# Patient Record
Sex: Male | Born: 1995 | Race: Black or African American | Hispanic: No | Marital: Single | Smoking: Never smoker
Health system: Southern US, Community
[De-identification: ages and names within clinical notes are randomized; demographics above are authoritative.]

## PROBLEM LIST (undated history)

## (undated) DIAGNOSIS — E669 Obesity, unspecified: Secondary | ICD-10-CM

## (undated) HISTORY — PX: APPENDECTOMY: SHX54

---

## 2015-10-03 ENCOUNTER — Encounter (HOSPITAL_BASED_OUTPATIENT_CLINIC_OR_DEPARTMENT_OTHER): Payer: Self-pay | Admitting: *Deleted

## 2015-10-03 ENCOUNTER — Emergency Department (HOSPITAL_BASED_OUTPATIENT_CLINIC_OR_DEPARTMENT_OTHER)
Admission: EM | Admit: 2015-10-03 | Discharge: 2015-10-03 | Disposition: A | Discharge status: Home / Self Care | Payer: Self-pay | Attending: Emergency Medicine | Admitting: Emergency Medicine

## 2015-10-03 DIAGNOSIS — J029 Acute pharyngitis, unspecified: Secondary | ICD-10-CM | POA: Insufficient documentation

## 2015-10-03 LAB — MONONUCLEOSIS SCREEN: Mono Screen: NEGATIVE

## 2015-10-03 LAB — RAPID STREP SCREEN (MED CTR MEBANE ONLY): Streptococcus, Group A Screen (Direct): NEGATIVE

## 2015-10-03 MED ORDER — PREDNISONE 50 MG PO TABS: ORAL_TABLET | ORAL | 0 refills | Status: DC

## 2015-10-03 MED ORDER — PREDNISONE 50 MG PO TABS
60.0000 mg | ORAL_TABLET | Freq: Once | ORAL | Status: AC
  Administered 2015-10-03: 60 mg via ORAL
  Filled 2015-10-03: qty 1

## 2015-10-03 MED ORDER — IBUPROFEN 400 MG PO TABS
400.0000 mg | ORAL_TABLET | Freq: Once | ORAL | Status: AC
  Administered 2015-10-03: 400 mg via ORAL
  Filled 2015-10-03: qty 1

## 2015-10-03 MED ORDER — LIDOCAINE VISCOUS 2 % MT SOLN: 15.0000 mL | Freq: Four times a day (QID) | OROMUCOSAL | 0 refills | Status: DC | PRN

## 2015-10-03 MED ORDER — LIDOCAINE VISCOUS 2 % MT SOLN
20.0000 mL | Freq: Once | OROMUCOSAL | Status: AC
  Administered 2015-10-03: 15 mL via OROMUCOSAL
  Filled 2015-10-03: qty 30

## 2015-10-03 MED FILL — LIDOCAINE 2% VISCOUS SOLN: 2 | 2 days supply | Qty: 100 | Fill #0

## 2015-10-03 NOTE — ED Provider Notes (Signed)
[facility] Provider Note   CSN: [id number] Arrival date & time: 10/03/15  1143  First Provider Contact:  None       History   Chief Complaint Chief Complaint  Patient presents with  . Sore Throat    HPI  Blood pressure 124/80, pulse 87, temperature 98.9 F (37.2 C), temperature source Oral, resp. rate 20, height  (1.905 m), weight (!) 163.3 kg, SpO2 97 %.  Steve Tate is a 20 y.o. male complaining of sore throat with pain when swallowing, occipital headache and neck pain onset 1 week ago. Patient has been taking acetaminophen at home with little relief. He denies fever, chills, sick contacts, pain with neck movement, otalgia, difficulty swallowing his secretions, change in voice.  HPI  History reviewed. No pertinent past medical history.  There are no active problems to display for this patient.   Past Surgical History:  Procedure Laterality Date  . APPENDECTOMY         Home Medications    Prior to Admission medications   Not on File    Family History No family history on file.  Social History Social History  Substance Use Topics  . Smoking status: Never Smoker  . Smokeless tobacco: Never Used  . Alcohol use No     Allergies   Review of patient's allergies indicates no known allergies.   Review of Systems Review of Systems  10 systems reviewed and found to be negative, except as noted in the HPI.   Physical Exam Updated Vital Signs BP 124/80   Pulse 87   Temp 98.9 F (37.2 C) (Oral)   Resp 20   Ht  (1.905 m)   Wt (!) 163.3 kg   SpO2 97%   BMI 45.00 kg/m   Physical Exam  Constitutional: He is oriented to person, place, and time. He appears well-developed and well-nourished. No distress.  HENT:  Head: Normocephalic and atraumatic.  Mouth/Throat: Oropharyngeal exudate present.  Difficult to visualize posterior pharynx, bilateral tonsillar hypertrophy with what appears to be exudate. Patient has no  difficulty swallowing secretions, no tenderness to palpation under tongue, no tonge elevation.  Eyes: Conjunctivae and EOM are normal. Pupils are equal, round, and reactive to light.  Neck: Normal range of motion.  FROM to C-spine. Pt can touch chin to chest without discomfort. No TTP of midline cervical spine.   Cardiovascular: Normal rate, regular rhythm and intact distal pulses.   Pulmonary/Chest: Effort normal and breath sounds normal.  Abdominal: Soft. There is no tenderness.  Musculoskeletal: Normal range of motion.  Neurological: He is alert and oriented to person, place, and time.  Follows commands, Clear, goal oriented speech, Strength is 5 out of 5x4 extremities, patient ambulates with a coordinated in nonantalgic gait. Sensation is grossly intact.   Skin: He is not diaphoretic.  Psychiatric: He has a normal mood and affect.  Nursing note and vitals reviewed.    ED Treatments / Results  Labs (all labs ordered are listed, but only abnormal results are displayed) Labs Reviewed  RAPID STREP SCREEN (NOT AT [facility])  CULTURE, GROUP A STREP [facility])  MONONUCLEOSIS SCREEN    EKG  EKG Interpretation None       Radiology No results found.  Procedures Procedures (including critical care time)  Medications Ordered in ED Medications  lidocaine (XYLOCAINE) 2 % viscous mouth solution 20 mL (not administered)  predniSONE (DELTASONE) tablet 60 mg (not administered)  ibuprofen (ADVIL,MOTRIN) tablet 400 mg (not administered)  Initial Impression / Assessment and Plan / ED Course  I have reviewed the triage vital signs and the nursing notes.  Pertinent labs & imaging results that were available during my care of the patient were reviewed by me and considered in my medical decision making (see chart for details).  Clinical Course    Vitals:   10/03/15 1157 10/03/15 1158  BP:  124/80  Pulse:  87  Resp:  20  Temp:  98.9 F (37.2 C)  TempSrc:  Oral  SpO2:  97%    Weight: (!) 163.3 kg   Height: 6\' 3"  (1.905 m)     Medications  lidocaine (XYLOCAINE) 2 % viscous mouth solution 20 mL (not administered)  predniSONE (DELTASONE) tablet 60 mg (not administered)  ibuprofen (ADVIL,MOTRIN) tablet 400 mg (not administered)    Steve Tate is 20 y.o. male presenting with Sore throat and headache onset 1 week ago, neuro exam is nonfocal, no meningeal signs, patient is nontoxic appearing. He does have exudate. Rapid strep is negative, will check mono and strep culture pending. Patient given viscous lidocaine, prednisone and Advil for comfort.  No signs of deep tissue infection, patient feels improved, rapid strep and mono are negative, patient given work note and return precautions, resource guide given  Evaluation does not show pathology that would require ongoing emergent intervention or inpatient treatment. Pt is hemodynamically stable and mentating appropriately. Discussed findings and plan with patient/guardian, who agrees with care plan. All questions answered. Return precautions discussed and outpatient follow up given.      Final Clinical Impressions(s) / ED Diagnoses   Final diagnoses:  Viral pharyngitis    New Prescriptions New Prescriptions   LIDOCAINE (XYLOCAINE) 2 % SOLUTION    Use as directed 15 mLs in the mouth or throat every 6 (six) hours as needed.   PREDNISONE (DELTASONE) 50 MG TABLET    Take 1 tablet daily with breakfast     Wynetta Emeryicole Kieth Hartis, PA-C 10/03/15 1407    Maia PlanJoshua G Long, MD 10/03/15 1929

## 2015-10-03 NOTE — ED Triage Notes (Signed)
Sore throat, neck and head pain for a week. He has been taking OTC cold medications with not much relief.

## 2015-10-03 NOTE — Discharge Instructions (Signed)
Your strep and mono tests are negative, if the strep culture comes back positive we will call you. Rest, wash your hands frequently.  Do not hesitate to return to the emergency room for any new, worsening or concerning symptoms.  For pain control please take Ibuprofen (also known as Motrin or Advil)  (this is normally 2 over the counter pills) every 6 hours. Take with food to minimize stomach irritation.

## 2015-10-05 LAB — CULTURE, GROUP A STREP (THRC)

## 2017-06-13 ENCOUNTER — Other Ambulatory Visit: Payer: Self-pay

## 2017-06-13 ENCOUNTER — Emergency Department (HOSPITAL_BASED_OUTPATIENT_CLINIC_OR_DEPARTMENT_OTHER)
Admission: EM | Admit: 2017-06-13 | Discharge: 2017-06-13 | Disposition: A | Discharge status: Home / Self Care | Payer: Self-pay | Attending: Emergency Medicine | Admitting: Emergency Medicine

## 2017-06-13 ENCOUNTER — Encounter (HOSPITAL_BASED_OUTPATIENT_CLINIC_OR_DEPARTMENT_OTHER): Payer: Self-pay | Admitting: Emergency Medicine

## 2017-06-13 DIAGNOSIS — R202 Paresthesia of skin: Secondary | ICD-10-CM | POA: Insufficient documentation

## 2017-06-13 DIAGNOSIS — Z79899 Other long term (current) drug therapy: Secondary | ICD-10-CM | POA: Insufficient documentation

## 2017-06-13 LAB — URINALYSIS, ROUTINE W REFLEX MICROSCOPIC
Bilirubin Urine: NEGATIVE
Glucose, UA: NEGATIVE mg/dL
Hgb urine dipstick: NEGATIVE
Ketones, ur: NEGATIVE mg/dL
Leukocytes, UA: NEGATIVE
Nitrite: NEGATIVE
Protein, ur: NEGATIVE mg/dL
Specific Gravity, Urine: 1.025 (ref 1.005–1.030)
pH: 6.5 (ref 5.0–8.0)

## 2017-06-13 LAB — BASIC METABOLIC PANEL
Anion gap: 7 (ref 5–15)
BUN: 11 mg/dL (ref 6–20)
CO2: 24 mmol/L (ref 22–32)
Calcium: 9 mg/dL (ref 8.9–10.3)
Chloride: 104 mmol/L (ref 101–111)
Creatinine, Ser: 1.03 mg/dL (ref 0.61–1.24)
GFR calc Af Amer: >60 mL/min (ref >60)
GFR calc non Af Amer: >60 mL/min (ref >60)
Glucose, Bld: 79 mg/dL (ref 65–99)
Potassium: 3.7 mmol/L (ref 3.5–5.1)
Sodium: 135 mmol/L (ref 135–145)

## 2017-06-13 LAB — CBC WITH DIFFERENTIAL/PLATELET
Basophils Absolute: 0 10*3/uL (ref 0.0–0.1)
Basophils Relative: 0 %
Eosinophils Absolute: 0.2 10*3/uL (ref 0.0–0.7)
Eosinophils Relative: 3 %
HCT: 38.3 % — ABNORMAL LOW (ref 39.0–52.0)
Hemoglobin: 12.7 g/dL — ABNORMAL LOW (ref 13.0–17.0)
Lymphocytes Relative: 37 %
Lymphs Abs: 3.2 10*3/uL (ref 0.7–4.0)
MCH: 27.9 pg (ref 26.0–34.0)
MCHC: 33.2 g/dL (ref 30.0–36.0)
MCV: 84.2 fL (ref 78.0–100.0)
Monocytes Absolute: 1 10*3/uL (ref 0.1–1.0)
Monocytes Relative: 11 %
Neutro Abs: 4.3 10*3/uL (ref 1.7–7.7)
Neutrophils Relative %: 49 %
Platelets: 348 10*3/uL (ref 150–400)
RBC: 4.55 MIL/uL (ref 4.22–5.81)
RDW: 12.1 % (ref 11.5–15.5)
WBC: 8.7 10*3/uL (ref 4.0–10.5)

## 2017-06-13 LAB — CBG MONITORING, ED: Glucose-Capillary: 70 mg/dL (ref 65–99)

## 2017-06-13 NOTE — ED Provider Notes (Signed)
[facility] EMERGENCY DEPARTMENT Provider Note   CSN: [id number] Arrival date & time: 06/13/17  1507     History   Chief Complaint Chief Complaint  Patient presents with  . Tingling    HPI Steve Tate is a 22 y.o. male.  HPI   Steve Tate is a 22 y.o. male, patient with no known medical problems, presenting to the ED with intermittent tingling in hands for the past several months. Sometimes bilateral and sometimes individually. Lasts for a few minutes at a time.  Also endorses fatigue and frequent urination. States, "One of my family members was just diagnosed with diabetes and I wanted to get checked too."  Denies N/V/D, chest pain, abdominal pain, weakness, numbness, fever/chills, dysuria, hematuria, or any other complaints.     History reviewed. No pertinent past medical history.  There are no active problems to display for this patient.   Past Surgical History:  Procedure Laterality Date  . APPENDECTOMY          Home Medications    Prior to Admission medications   Medication Sig Start Date End Date Taking? Authorizing Provider  lidocaine (XYLOCAINE) 2 % solution Use as directed 15 mLs in the mouth or throat every 6 (six) hours as needed. 10/03/15   Pisciotta, Joni Reining, PA-C  predniSONE (DELTASONE) 50 MG tablet Take 1 tablet daily with breakfast 10/03/15   Pisciotta, Joni Reining, PA-C    Family History No family history on file.  Social History Social History   Tobacco Use  . Smoking status: Never Smoker  . Smokeless tobacco: Never Used  Substance Use Topics  . Alcohol use: No  . Drug use: No     Allergies   Patient has no known allergies.   Review of Systems Review of Systems  Constitutional: Negative for chills, diaphoresis and fever.  Eyes: Negative for visual disturbance.  Respiratory: Negative for shortness of breath.   Cardiovascular: Negative for chest pain.  Gastrointestinal: Negative for abdominal pain, blood in  stool, diarrhea, nausea and vomiting.  Neurological: Negative for dizziness, weakness, numbness and headaches.       Hand tingling  All other systems reviewed and are negative.    Physical Exam Updated Vital Signs BP 135/76   Pulse 74   Temp 98.3 F (36.8 C) (Oral)   Resp 20   Ht 6\' 2"  (1.88 m)   Wt (!) 184.4 kg (406 lb 8 oz)   SpO2 99%   BMI 52.19 kg/m   Physical Exam  Constitutional: He appears well-developed and well-nourished. No distress.  Patient playing on his phone throughout interview.  HENT:  Head: Normocephalic and atraumatic.  Mouth/Throat: Oropharynx is clear and moist.  Eyes: Pupils are equal, round, and reactive to light. Conjunctivae and EOM are normal.  Neck: Neck supple.  Cardiovascular: Normal rate, regular rhythm, normal heart sounds and intact distal pulses.  Pulses:      Radial pulses are 2+ on the right side, and 2+ on the left side.  Pulmonary/Chest: Effort normal and breath sounds normal. No respiratory distress.  Abdominal: Soft. There is no tenderness. There is no guarding.  Musculoskeletal: He exhibits no edema.  Lymphadenopathy:    He has no cervical adenopathy.  Neurological: He is alert.  No sensory deficits.  No noted speech deficits. No aphasia. Patient handles oral secretions without difficulty. No noted swallowing defects.  Equal grip strength bilaterally. Strength 5/5 in the upper extremities. Strength 5/5 with flexion and extension of the hips, knees, and ankles  bilaterally.  Negative Romberg. No gait disturbance.  Coordination intact including heel to shin and finger to nose.  Cranial nerves III-XII grossly intact.  No facial droop.   Skin: Skin is warm and dry. Capillary refill takes less than 2 seconds. He is not diaphoretic.  Hands and arms appropriately warm to the touch.  Psychiatric: He has a normal mood and affect. His behavior is normal.  Nursing note and vitals reviewed.    ED Treatments / Results  Labs (all labs  ordered are listed, but only abnormal results are displayed) Labs Reviewed  CBC WITH DIFFERENTIAL/PLATELET - Abnormal; Notable for the following components:      Result Value   Hemoglobin 12.7 (*)    HCT 38.3 (*)    All other components within normal limits  URINALYSIS, ROUTINE W REFLEX MICROSCOPIC  BASIC METABOLIC PANEL  CBG MONITORING, ED    EKG None  Radiology No results found.  Procedures Procedures (including critical care time)  Medications Ordered in ED Medications - No data to display   Initial Impression / Assessment and Plan / ED Course  I have reviewed the triage vital signs and the nursing notes.  Pertinent labs & imaging results that were available during my care of the patient were reviewed by me and considered in my medical decision making (see chart for details).     Patient presents with tingling in the hands as well as fatigue.  Minor anemia noted on CBC.  Patient lab work otherwise without abnormality and overall reassuring.  Patient will need to follow-up with a PCP on this matter.  Resources given.  Return precautions discussed.  Final Clinical Impressions(s) / ED Diagnoses   Final diagnoses:  Paresthesia of both hands    ED Discharge Orders    None       Concepcion LivingJoy, Shawn C, PA-C 06/13/17 Mitzi [id number]    Goldston, Scott, MD 06/14/17 1007

## 2017-06-13 NOTE — ED Triage Notes (Signed)
Pt presents with concerns of diabetes. He reports that he has tingling in both hands, fatigue and frequent urination. Family hx of diabetes.

## 2017-06-13 NOTE — Discharge Instructions (Addendum)
Your lab results showed no abnormalities that would be consistent with diabetes.   There is evidence of minor anemia.  This could be a cause for fatigue and hand tingling.  This should be further evaluated by a primary care provider.

## 2017-06-17 ENCOUNTER — Encounter (HOSPITAL_BASED_OUTPATIENT_CLINIC_OR_DEPARTMENT_OTHER): Payer: Self-pay | Admitting: *Deleted

## 2017-06-17 ENCOUNTER — Emergency Department (HOSPITAL_BASED_OUTPATIENT_CLINIC_OR_DEPARTMENT_OTHER): Payer: No Typology Code available for payment source

## 2017-06-17 ENCOUNTER — Emergency Department (HOSPITAL_BASED_OUTPATIENT_CLINIC_OR_DEPARTMENT_OTHER)
Admission: EM | Admit: 2017-06-17 | Discharge: 2017-06-17 | Disposition: A | Discharge status: Home / Self Care | Payer: No Typology Code available for payment source | Attending: Emergency Medicine | Admitting: Emergency Medicine

## 2017-06-17 ENCOUNTER — Other Ambulatory Visit: Payer: Self-pay

## 2017-06-17 DIAGNOSIS — M545 Low back pain: Secondary | ICD-10-CM | POA: Insufficient documentation

## 2017-06-17 DIAGNOSIS — R52 Pain, unspecified: Secondary | ICD-10-CM

## 2017-06-17 LAB — URINALYSIS, ROUTINE W REFLEX MICROSCOPIC
Bilirubin Urine: NEGATIVE
Glucose, UA: NEGATIVE mg/dL
Hgb urine dipstick: NEGATIVE
Ketones, ur: NEGATIVE mg/dL
Leukocytes, UA: NEGATIVE
Nitrite: NEGATIVE
Protein, ur: NEGATIVE mg/dL
Specific Gravity, Urine: 1.02 (ref 1.005–1.030)
pH: 6 (ref 5.0–8.0)

## 2017-06-17 MED ORDER — ACETAMINOPHEN 500 MG PO TABS
1000.0000 mg | ORAL_TABLET | Freq: Once | ORAL | Status: AC
  Administered 2017-06-17: 1000 mg via ORAL
  Filled 2017-06-17: qty 2

## 2017-06-17 MED ORDER — IBUPROFEN 400 MG PO TABS: 400.0000 mg | ORAL_TABLET | Freq: Four times a day (QID) | ORAL | 0 refills | Status: DC | PRN

## 2017-06-17 MED ORDER — ACETAMINOPHEN 325 MG PO TABS: 650.0000 mg | ORAL_TABLET | Freq: Four times a day (QID) | ORAL | 0 refills | Status: DC | PRN
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
## 2017-06-17 NOTE — Discharge Instructions (Addendum)
You may alternate taking Tylenol and Ibuprofen as needed for pain control. You may take 400-600 mg of ibuprofen every 6 hours and 500-1000 mg of Tylenol every 6 hours. Do not exceed 4000 mg of Tylenol daily as this can lead to liver damage. Also, make sure to take Ibuprofen with meals as it can cause an upset stomach. Do not take other NSAIDs while taking Ibuprofen such as (Aleve, Naprosyn, Aspirin, Celebrex, etc) and do not take more than the prescribed dose as this can lead to ulcers and bleeding in your GI tract. You may use warm and cold compresses to help with your symptoms.  ° °Please follow up with your primary doctor within the next 7-10 days for re-evaluation and further treatment of your symptoms.  ° °Return to the emergency department immediately if you experience any back pain associated with fevers, loss of control of your bowels/bladder, weakness/numbness to your legs, numbness to your groin area, inability to walk, or inability to urinate.  ° ° °
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
## 2017-09-07 ENCOUNTER — Emergency Department (HOSPITAL_BASED_OUTPATIENT_CLINIC_OR_DEPARTMENT_OTHER): Payer: No Typology Code available for payment source

## 2017-09-07 ENCOUNTER — Emergency Department (HOSPITAL_BASED_OUTPATIENT_CLINIC_OR_DEPARTMENT_OTHER)
Admission: EM | Admit: 2017-09-07 | Discharge: 2017-09-07 | Disposition: A | Discharge status: Home / Self Care | Payer: No Typology Code available for payment source | Attending: Emergency Medicine | Admitting: Emergency Medicine

## 2017-09-07 ENCOUNTER — Other Ambulatory Visit: Payer: Self-pay

## 2017-09-07 ENCOUNTER — Encounter (HOSPITAL_BASED_OUTPATIENT_CLINIC_OR_DEPARTMENT_OTHER): Payer: Self-pay

## 2017-09-07 DIAGNOSIS — Z79899 Other long term (current) drug therapy: Secondary | ICD-10-CM | POA: Diagnosis not present

## 2017-09-07 DIAGNOSIS — R0781 Pleurodynia: Secondary | ICD-10-CM

## 2017-09-07 DIAGNOSIS — Y929 Unspecified place or not applicable: Secondary | ICD-10-CM | POA: Diagnosis not present

## 2017-09-07 DIAGNOSIS — R51 Headache: Secondary | ICD-10-CM | POA: Insufficient documentation

## 2017-09-07 DIAGNOSIS — R0789 Other chest pain: Secondary | ICD-10-CM | POA: Insufficient documentation

## 2017-09-07 DIAGNOSIS — Y939 Activity, unspecified: Secondary | ICD-10-CM | POA: Insufficient documentation

## 2017-09-07 DIAGNOSIS — Y999 Unspecified external cause status: Secondary | ICD-10-CM | POA: Insufficient documentation

## 2017-09-07 MED ORDER — METHOCARBAMOL 500 MG PO TABS: 500.0000 mg | ORAL_TABLET | Freq: Two times a day (BID) | ORAL | 0 refills | Status: DC
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
## 2017-09-07 NOTE — ED Notes (Signed)
Pt verbalizes understanding of d/c instructions and denies any further needs at this time. 

## 2017-09-07 NOTE — Discharge Instructions (Signed)
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
## 2018-08-06 ENCOUNTER — Other Ambulatory Visit: Payer: Self-pay

## 2018-08-06 ENCOUNTER — Emergency Department (HOSPITAL_BASED_OUTPATIENT_CLINIC_OR_DEPARTMENT_OTHER): Payer: Self-pay

## 2018-08-06 ENCOUNTER — Encounter (HOSPITAL_BASED_OUTPATIENT_CLINIC_OR_DEPARTMENT_OTHER): Payer: Self-pay | Admitting: Emergency Medicine

## 2018-08-06 ENCOUNTER — Emergency Department (HOSPITAL_BASED_OUTPATIENT_CLINIC_OR_DEPARTMENT_OTHER)
Admission: EM | Admit: 2018-08-06 | Discharge: 2018-08-06 | Disposition: A | Discharge status: Home / Self Care | Payer: Self-pay | Attending: Emergency Medicine | Admitting: Emergency Medicine

## 2018-08-06 DIAGNOSIS — U071 COVID-19: Secondary | ICD-10-CM | POA: Insufficient documentation

## 2018-08-06 DIAGNOSIS — J129 Viral pneumonia, unspecified: Secondary | ICD-10-CM | POA: Insufficient documentation

## 2018-08-06 HISTORY — DX: Obesity, unspecified: E66.9

## 2018-08-06 LAB — CBC WITH DIFFERENTIAL/PLATELET
Abs Immature Granulocytes: 0.01 10*3/uL (ref 0.00–0.07)
Basophils Absolute: 0 10*3/uL (ref 0.0–0.1)
Basophils Relative: 0 %
Eosinophils Absolute: 0 10*3/uL (ref 0.0–0.5)
Eosinophils Relative: 1 %
HCT: 41.1 % (ref 39.0–52.0)
Hemoglobin: 12.4 g/dL — ABNORMAL LOW (ref 13.0–17.0)
Immature Granulocytes: 0 %
Lymphocytes Relative: 36 %
Lymphs Abs: 1.7 10*3/uL (ref 0.7–4.0)
MCH: 26.3 pg (ref 26.0–34.0)
MCHC: 30.2 g/dL (ref 30.0–36.0)
MCV: 87.1 fL (ref 80.0–100.0)
Monocytes Absolute: 0.7 10*3/uL (ref 0.1–1.0)
Monocytes Relative: 14 %
Neutro Abs: 2.2 10*3/uL (ref 1.7–7.7)
Neutrophils Relative %: 49 %
Platelets: 255 10*3/uL (ref 150–400)
RBC: 4.72 MIL/uL (ref 4.22–5.81)
RDW: 11.9 % (ref 11.5–15.5)
WBC: 4.6 10*3/uL (ref 4.0–10.5)
nRBC: 0 % (ref 0.0–0.2)

## 2018-08-06 LAB — COMPREHENSIVE METABOLIC PANEL
ALT: 20 U/L (ref 0–44)
AST: 20 U/L (ref 15–41)
Albumin: 3.7 g/dL (ref 3.5–5.0)
Alkaline Phosphatase: 50 U/L (ref 38–126)
Anion gap: 7 (ref 5–15)
BUN: 9 mg/dL (ref 6–20)
CO2: 27 mmol/L (ref 22–32)
Calcium: 8.6 mg/dL — ABNORMAL LOW (ref 8.9–10.3)
Chloride: 104 mmol/L (ref 98–111)
Creatinine, Ser: 1.11 mg/dL (ref 0.61–1.24)
GFR calc Af Amer: >60 mL/min (ref >60)
GFR calc non Af Amer: >60 mL/min (ref >60)
Glucose, Bld: 85 mg/dL (ref 70–99)
Potassium: 3.8 mmol/L (ref 3.5–5.1)
Sodium: 138 mmol/L (ref 135–145)
Total Bilirubin: 0.6 mg/dL (ref 0.3–1.2)
Total Protein: 7.2 g/dL (ref 6.5–8.1)

## 2018-08-06 LAB — TROPONIN I: Troponin I: <0.03 ng/mL (ref <0.03)

## 2018-08-06 LAB — D-DIMER, QUANTITATIVE: D-Dimer, Quant: 0.57 ug/mL-FEU — ABNORMAL HIGH (ref 0.00–0.50)

## 2018-08-06 LAB — BRAIN NATRIURETIC PEPTIDE: B Natriuretic Peptide: 10.4 pg/mL (ref 0.0–100.0)

## 2018-08-06 MED ORDER — ACETAMINOPHEN 325 MG PO TABS
650.0000 mg | ORAL_TABLET | Freq: Once | ORAL | Status: AC
  Administered 2018-08-06: 650 mg via ORAL
  Filled 2018-08-06: qty 2

## 2018-08-06 MED ORDER — KETOROLAC TROMETHAMINE 30 MG/ML IJ SOLN
15.0000 mg | Freq: Once | INTRAMUSCULAR | Status: AC
  Administered 2018-08-06: 15 mg via INTRAVENOUS
  Filled 2018-08-06: qty 1

## 2018-08-06 MED ORDER — SODIUM CHLORIDE 0.9 % IV BOLUS
1000.0000 mL | Freq: Once | INTRAVENOUS | Status: AC
  Administered 2018-08-06: 1000 mL via INTRAVENOUS

## 2018-08-06 MED ORDER — AZITHROMYCIN 250 MG PO TABS: 250.0000 mg | ORAL_TABLET | Freq: Every day | ORAL | 0 refills | Status: DC

## 2018-08-06 MED ORDER — IOHEXOL 350 MG/ML SOLN
100.0000 mL | Freq: Once | INTRAVENOUS | Status: AC | PRN
  Administered 2018-08-06: 100 mL via INTRAVENOUS

## 2018-08-06 NOTE — ED Notes (Signed)
Portable CXR done.

## 2018-08-06 NOTE — ED Notes (Signed)
O2 sat 96-97% on RA while ambulating °

## 2018-08-06 NOTE — ED Triage Notes (Addendum)
SOB and chest pain x 4 days, and noticed a lump to right axilla area. Has felt weak today , pain increases with movement

## 2018-08-06 NOTE — ED Provider Notes (Signed)
[facility] EMERGENCY DEPARTMENT Provider Note   CSN: [id number] Arrival date & time: 08/06/18  1434    History   Chief Complaint Chief Complaint  Patient presents with  . Shortness of Breath    HPI Steve Tate is a 23 y.o. male.     HPI  23 year old male presents with 4 days of shortness of breath and chest pain.  He states he has the symptoms all the time since it started but it is much worse whenever he tries to lift things or walk.  He will get sharp anterior chest pain, with inspiration as well as when doing activities.  No lower extremity swelling.  No abdominal pain, back pain, vomiting.  No cough or fever.  He is tried some ibuprofen with no significant relief.  When it is at its worst is about a 10.  He also feels weak when he is walking.  No sick contacts or known contacts of someone with COVID-19. He has also noticed a lump in his right axilla.  Past Medical History:  Diagnosis Date  . Obesity     There are no active problems to display for this patient.   Past Surgical History:  Procedure Laterality Date  . APPENDECTOMY          Home Medications    Prior to Admission medications   Medication Sig Start Date End Date Taking? Authorizing Provider  acetaminophen (TYLENOL) 325 MG tablet Take 2 tablets (650 mg total) by mouth every 6 (six) hours as needed. Do not take more than 4000mg  of tylenol per day 06/17/17   Couture, Cortni S, PA-C  azithromycin (ZITHROMAX) 250 MG tablet Take 1 tablet (250 mg total) by mouth daily. Take first 2 tablets together, then 1 every day until finished. 08/06/18   Pricilla LovelessGoldston, Naven Giambalvo, MD  ibuprofen (ADVIL,MOTRIN) 400 MG tablet Take 1 tablet (400 mg total) by mouth every 6 (six) hours as needed. 06/17/17   Couture, Cortni S, PA-C  lidocaine (XYLOCAINE) 2 % solution Use as directed 15 mLs in the mouth or throat every 6 (six) hours as needed. 10/03/15   Pisciotta, Joni ReiningNicole, PA-C  methocarbamol (ROBAXIN) 500 MG tablet Take 1 tablet  (500 mg total) by mouth 2 (two) times daily. 09/07/17   Couture, Cortni S, PA-C    Family History No family history on file.  Social History Social History   Tobacco Use  . Smoking status: Never Smoker  . Smokeless tobacco: Never Used  Substance Use Topics  . Alcohol use: No  . Drug use: No     Allergies   Patient has no known allergies.   Review of Systems Review of Systems  Constitutional: Negative for fever.  Respiratory: Positive for shortness of breath. Negative for cough.   Cardiovascular: Positive for chest pain. Negative for leg swelling.  Gastrointestinal: Negative for abdominal pain, nausea and vomiting.  Neurological: Positive for weakness.  All other systems reviewed and are negative.    Physical Exam Updated Vital Signs BP 115/84   Pulse 70   Temp 99.2 F (37.3 C) (Oral)   Resp 20   Ht 6\' 2"  (1.88 m)   Wt (!) 167.8 kg   SpO2 100%   BMI 47.51 kg/m   Physical Exam Vitals signs and nursing note reviewed.  Constitutional:      General: He is not in acute distress.    Appearance: He is well-developed. He is obese. He is not ill-appearing or diaphoretic.  HENT:     Head:  Normocephalic and atraumatic.     Right Ear: External ear normal.     Left Ear: External ear normal.     Nose: Nose normal.  Eyes:     General:        Right eye: No discharge.        Left eye: No discharge.  Neck:     Musculoskeletal: Neck supple.  Cardiovascular:     Rate and Rhythm: Regular rhythm. Tachycardia present.     Heart sounds: Normal heart sounds.     Comments: HR high 90s, 100 Pulmonary:     Effort: Pulmonary effort is normal.     Breath sounds: Normal breath sounds.  Chest:     Chest wall: Tenderness (around sternum) present.  Abdominal:     Palpations: Abdomen is soft.     Tenderness: There is no abdominal tenderness.  Lymphadenopathy:     Upper Body:     Right upper body: Axillary adenopathy present.     Comments: Shotty mobile lymphadenopathy, mildly  tender in right axilla  Skin:    General: Skin is warm and dry.  Neurological:     Mental Status: He is alert.  Psychiatric:        Mood and Affect: Mood is not anxious.      ED Treatments / Results  Labs (all labs ordered are listed, but only abnormal results are displayed) Labs Reviewed  COMPREHENSIVE METABOLIC PANEL - Abnormal; Notable for the following components:      Result Value   Calcium 8.6 (*)    All other components within normal limits  CBC WITH DIFFERENTIAL/PLATELET - Abnormal; Notable for the following components:   Hemoglobin 12.4 (*)    All other components within normal limits  D-DIMER, QUANTITATIVE (NOT AT [facility]) - Abnormal; Notable for the following components:   D-Dimer, Quant 0.57 (*)    All other components within normal limits  NOVEL CORONAVIRUS, NAA (HOSPITAL ORDER, SEND-OUT TO REF LAB)  BRAIN NATRIURETIC PEPTIDE  TROPONIN I    EKG EKG Interpretation  Date/Time:  Sunday August 06 2018 14:42:44 EDT Ventricular Rate:  96 PR Interval:    QRS Duration: 88 QT Interval:  311 QTC Calculation: 393 R Axis:   67 Text Interpretation:  Sinus rhythm Borderline short PR interval Borderline T wave abnormalities No old tracing to compare Confirmed by Sherwood Gambler [phone]) on 08/06/2018 3:07:33 PM   Radiology Ct Angio Chest Pe W And/or Wo Contrast  Result Date: 08/06/2018 CLINICAL DATA:  Short of breath and chest pain 4 days. Lump in the right axillary region. Weakness today. Pain with movement. EXAM: CT ANGIOGRAPHY CHEST WITH CONTRAST TECHNIQUE: Multidetector CT imaging of the chest was performed using the standard protocol during bolus administration of intravenous contrast. Multiplanar CT image reconstructions and MIPs were obtained to evaluate the vascular anatomy. CONTRAST:  136mL OMNIPAQUE IOHEXOL 350 MG/ML SOLN COMPARISON:  Current chest radiograph. FINDINGS: Cardiovascular: There is satisfactory opacification of the pulmonary arteries to the segmental level.  There is no evidence of a pulmonary embolism. Heart is normal in size and configuration. No pericardial effusion. No coronary artery calcifications. Great vessels are within normal limits. Mediastinum/Nodes: Normal thyroid. No neck base or axillary masses or adenopathy. Borderline enlarged right subcarinal lymph node measuring 1.5 cm in short axis. Mildly prominent subcentimeter hilar nodes. Trachea and esophagus are unremarkable. Lungs/Pleura: There are multiple bilateral rounded areas ground-glass and more confluent consolidation in both lungs with a basilar predominance. No evidence of pulmonary edema. No pleural  effusion or pneumothorax. Upper Abdomen: Unremarkable. Musculoskeletal: Normal. Review of the MIP images confirms the above findings. IMPRESSION: 1. No evidence of a pulmonary embolism. 2. Multiple bilateral rounded ground-glass and more confluent areas of lung opacity with a basilar predominance. There are a spectrum of findings in the lungs which can be seen with acute atypical infection (as well as other non-infectious etiologies). In particular, viral pneumonia (including COVID-19) should be considered in the appropriate clinical setting. These results were called by telephone at the time of interpretation on 08/06/2018 at 5:12 pm to Dr. Pricilla LovelessSCOTT Esiquio Boesen. Case was discussed. Patient has minimal temperature elevation, 99, but is short of breath. Electronically Signed   By: Amie Portlandavid  Ormond M.D.   On: 08/06/2018 17:13   Dg Chest Portable 1 View  Result Date: 08/06/2018 CLINICAL DATA:  Shortness of breath. EXAM: PORTABLE CHEST 1 VIEW COMPARISON:  September 07, 2017 FINDINGS: Cardiomediastinal silhouette is normal. Mild opacity in the right base. The heart, hila, mediastinum, lungs, and pleura are otherwise unremarkable. IMPRESSION: Mild right basilar opacity may represent early infiltrate versus atelectasis. Electronically Signed   By: Gerome Samavid  Williams III M.D   On: 08/06/2018 15:55    Procedures Procedures  (including critical care time)  Medications Ordered in ED Medications  sodium chloride 0.9 % bolus 1,000 mL ( Intravenous Stopped 08/06/18 1707)  ketorolac (TORADOL) 30 MG/ML injection 15 mg (15 mg Intravenous Given 08/06/18 1541)  acetaminophen (TYLENOL) tablet 650 mg (650 mg Oral Given 08/06/18 1531)  iohexol (OMNIPAQUE) 350 MG/ML injection 100 mL (100 mLs Intravenous Contrast Given 08/06/18 1624)     Initial Impression / Assessment and Plan / ED Course  I have reviewed the triage vital signs and the nursing notes.  Pertinent labs & imaging results that were available during my care of the patient were reviewed by me and considered in my medical decision making (see chart for details).        CT scan does not show PE but is concerning for COVID-19.  The patient overall is well-appearing.  He walked for several minutes in his room without any hypoxia.  He is feeling okay.  Thus I think it stable for him to go home.  Given that this could be an atypical pneumonia as well, will cover with azithromycin though given the current pandemic this is most likely COVID-19.  We discussed quarantine.  He otherwise has benign work-up and appears stable for discharge home with return precautions.  Steve GibsonDamien Rhoads was evaluated in Emergency Department on 08/06/2018 for the symptoms described in the history of present illness. He was evaluated in the context of the global COVID-19 pandemic, which necessitated consideration that the patient might be at risk for infection with the SARS-CoV-2 virus that causes COVID-19. Institutional protocols and algorithms that pertain to the evaluation of patients at risk for COVID-19 are in a state of rapid change based on information released by regulatory bodies including the CDC and federal and state organizations. These policies and algorithms were followed during the patient's care in the ED.   Final Clinical Impressions(s) / ED Diagnoses   Final diagnoses:  Viral  pneumonia  2019 novel coronavirus disease (COVID-19)    ED Discharge Orders         Ordered    azithromycin (ZITHROMAX) 250 MG tablet  Daily     08/06/18 1747           Pricilla LovelessGoldston, Jonahtan Manseau, MD 08/06/18 2108

## 2018-08-06 NOTE — Discharge Instructions (Signed)
Your CT scan shows what is probably a viral pneumonia.  This could be atypical pneumonia and so you are also given antibiotics today.  However presumed that it is COVID-19 and you need to quarantine yourself.  If you develop worsening shortness of breath, feeling like you are going to pass out, severe chest pain, coughing up blood, vomiting, or any other new/concerning symptoms then return to the ER for evaluation.

## 2018-08-12 LAB — NOVEL CORONAVIRUS, NAA (HOSP ORDER, SEND-OUT TO REF LAB; TAT 18-24 HRS): SARS-CoV-2, NAA: DETECTED — AB

## 2020-02-02 ENCOUNTER — Encounter (HOSPITAL_BASED_OUTPATIENT_CLINIC_OR_DEPARTMENT_OTHER): Payer: Self-pay | Admitting: Emergency Medicine

## 2020-02-02 ENCOUNTER — Emergency Department (HOSPITAL_BASED_OUTPATIENT_CLINIC_OR_DEPARTMENT_OTHER)
Admission: EM | Admit: 2020-02-02 | Discharge: 2020-02-02 | Disposition: A | Discharge status: Home / Self Care | Payer: 59 | Attending: Emergency Medicine | Admitting: Emergency Medicine

## 2020-02-02 ENCOUNTER — Other Ambulatory Visit: Payer: Self-pay

## 2020-02-02 DIAGNOSIS — K625 Hemorrhage of anus and rectum: Secondary | ICD-10-CM | POA: Diagnosis not present

## 2020-02-02 DIAGNOSIS — Z5321 Procedure and treatment not carried out due to patient leaving prior to being seen by health care provider: Secondary | ICD-10-CM | POA: Diagnosis not present

## 2020-02-02 DIAGNOSIS — R1030 Lower abdominal pain, unspecified: Secondary | ICD-10-CM | POA: Diagnosis present

## 2020-02-02 LAB — CBC
HCT: 39 % (ref 39.0–52.0)
Hemoglobin: 12.3 g/dL — ABNORMAL LOW (ref 13.0–17.0)
MCH: 27.1 pg (ref 26.0–34.0)
MCHC: 31.5 g/dL (ref 30.0–36.0)
MCV: 85.9 fL (ref 80.0–100.0)
Platelets: 365 K/uL (ref 150–400)
RBC: 4.54 MIL/uL (ref 4.22–5.81)
RDW: 11.7 % (ref 11.5–15.5)
WBC: 7 K/uL (ref 4.0–10.5)
nRBC: 0 % (ref 0.0–0.2)

## 2020-02-02 LAB — URINALYSIS, ROUTINE W REFLEX MICROSCOPIC
Bilirubin Urine: NEGATIVE
Glucose, UA: NEGATIVE mg/dL
Hgb urine dipstick: NEGATIVE
Ketones, ur: NEGATIVE mg/dL
Leukocytes,Ua: NEGATIVE
Nitrite: NEGATIVE
Protein, ur: NEGATIVE mg/dL
Specific Gravity, Urine: 1.025 (ref 1.005–1.030)
pH: 6.5 (ref 5.0–8.0)

## 2020-02-02 LAB — COMPREHENSIVE METABOLIC PANEL
ALT: 18 U/L (ref 0–44)
AST: 14 U/L — ABNORMAL LOW (ref 15–41)
Albumin: 3.8 g/dL (ref 3.5–5.0)
Alkaline Phosphatase: 49 U/L (ref 38–126)
Anion gap: 8 (ref 5–15)
BUN: 10 mg/dL (ref 6–20)
CO2: 27 mmol/L (ref 22–32)
Calcium: 9 mg/dL (ref 8.9–10.3)
Chloride: 103 mmol/L (ref 98–111)
Creatinine, Ser: 0.91 mg/dL (ref 0.61–1.24)
GFR, Estimated: >60 mL/min (ref >60)
Glucose, Bld: 86 mg/dL (ref 70–99)
Potassium: 3.7 mmol/L (ref 3.5–5.1)
Sodium: 138 mmol/L (ref 135–145)
Total Bilirubin: 0.3 mg/dL (ref 0.3–1.2)
Total Protein: 7 g/dL (ref 6.5–8.1)

## 2020-02-02 LAB — LIPASE, BLOOD: Lipase: 20 U/L (ref 11–51)

## 2020-02-02 NOTE — ED Triage Notes (Signed)
Low abd pain today with bright red rectal bleeding x 1.

## 2020-02-28 ENCOUNTER — Encounter: Payer: Self-pay | Admitting: Gastroenterology

## 2020-02-28 ENCOUNTER — Ambulatory Visit (INDEPENDENT_AMBULATORY_CARE_PROVIDER_SITE_OTHER): Payer: 59 | Admitting: Gastroenterology

## 2020-02-28 VITALS — BP 104/76 | HR 84 | Ht 74.0 in | Wt 389.6 lb

## 2020-02-28 DIAGNOSIS — R1032 Left lower quadrant pain: Secondary | ICD-10-CM | POA: Diagnosis not present

## 2020-02-28 DIAGNOSIS — R1084 Generalized abdominal pain: Secondary | ICD-10-CM

## 2020-02-28 DIAGNOSIS — K5900 Constipation, unspecified: Secondary | ICD-10-CM | POA: Insufficient documentation

## 2020-02-28 MED ORDER — HYOSCYAMINE SULFATE 0.125 MG SL SUBL: 0.1250 mg | SUBLINGUAL_TABLET | Freq: Three times a day (TID) | SUBLINGUAL | 1 refills | Status: DC | PRN

## 2020-02-28 NOTE — Patient Instructions (Addendum)
If you are age 25 or older, your body mass index should be between 23-30. Your Body mass index is 50.02 kg/m. If this is out of the aforementioned range listed, please consider follow up with your Primary Care Provider.  If you are age 54 or younger, your body mass index should be between 19-25. Your Body mass index is 50.02 kg/m. If this is out of the aformentioned range listed, please consider follow up with your Primary Care Provider.   Your provider has requested that you go to the basement level for lab work before leaving today. Press "B" on the elevator. The lab is located at the first door on the left as you exit the elevator.  We have sent the following medications to your pharmacy for you to pick up at your convenience:Levsin 0.169m every 8 hours as needed.  Start Miralax 1/2 cup to 1 full  Daily in 8 ounces of liquid.  You have been scheduled for a CT scan of the abdomen and pelvis [facility]. You are scheduled on 03/10/20  at 3:30. You should arrive 15 minutes prior to your appointment time for registration.  [facility]. The solution may taste better if refrigerated, but do NOT add ice or any other liquid to this solution. Shake well before drinking.   Please follow the written instructions below on the day of your exam:   1) Do not eat anything after 11:30am (4 hours prior to your test)   2) Drink 1 bottle of contrast @ 1:30pm (2 hours prior to your exam)  Remember to shake well before drinking and do NOT pour over ice.     Drink 1 bottle of contrast @ 2:30am (1 hour prior to your exam)   You may take any medications as prescribed with a small amount of water, if necessary. If you take any of the following medications: METFORMIN, GLUCOPHAGE, GLUCOVANCE, AVANDAMET, RIOMET, FORTAMET, AOak ViewMET, JANUMET, GLUMETZA or METAGLIP, you MAY be asked to HOLD this medication 48  hours AFTER the exam.   The purpose of you drinking the oral contrast is to aid in the visualization of your intestinal tract. The contrast solution may cause some diarrhea. Depending on your individual set of symptoms, you may also receive an intravenous injection of x-ray contrast/dye. [facility] depending on the type of exam you are having performed.   If you have any questions regarding your exam or if you need to reschedule, you may call [facility] at [phone] the hours of 8:00 am and 5:00 pm, Monday-Friday.   Thank you for choosing [facility] and [facility].  JAlonza Bogus PA-C

## 2020-02-28 NOTE — Progress Notes (Signed)
02/28/2020 Steve Tate [id number] Jun 01, 1995   HISTORY OF PRESENT ILLNESS: This is a 25 year old male who is new to our office.  He was referred here by Steve Riffle, PA, for evaluation regarding rectal bleeding.  He tells me that he had 1 episode of rectal bleeding about 3 weeks ago.  That is the only time he has ever seen blood.  He does admit to longstanding GI issues mostly with constipation and intermittent abdominal pains.  He admits to having 1 or 2 stools daily but often needs to strain to have a bowel movement and they are sometimes hard.  He does not take anything regularly for his constipation.  He does admit to some discomfort in his anal/rectal area at times when he defecates.  He says that generally when he has abdominal pain it is diffuse over his abdomen, but since November he has had persistent left lower quadrant abdominal pain.  He denies any fevers or chills.  No issues with diarrhea.  CBC, CMP, lipase were all unremarkable back in December.   Past Medical History:  Diagnosis Date  . Obesity    Past Surgical History:  Procedure Laterality Date  . APPENDECTOMY      reports that he has never smoked. He has never used smokeless tobacco. He reports current alcohol use. He reports that he does not use drugs. family history includes Throat cancer in his paternal grandmother. No Known Allergies    Outpatient Encounter Medications as of 02/28/2020  Medication Sig  . acetaminophen (TYLENOL) 325 MG tablet Take 2 tablets (650 mg total) by mouth every 6 (six) hours as needed. Do not take more than 4000mg  of tylenol per day  . ibuprofen (ADVIL,MOTRIN) 400 MG tablet Take 1 tablet (400 mg total) by mouth every 6 (six) hours as needed.  . phentermine 37.5 MG capsule Take 37.5 mg by mouth every morning.  . [DISCONTINUED] azithromycin (ZITHROMAX) 250 MG tablet Take 1 tablet (250 mg total) by mouth daily. Take first 2 tablets together, then 1 every day until finished.  .  [DISCONTINUED] lidocaine (XYLOCAINE) 2 % solution Use as directed 15 mLs in the mouth or throat every 6 (six) hours as needed.  . [DISCONTINUED] methocarbamol (ROBAXIN) 500 MG tablet Take 1 tablet (500 mg total) by mouth 2 (two) times daily.   No facility-administered encounter medications on file as of 02/28/2020.     REVIEW OF SYSTEMS  : All other systems reviewed and negative except where noted in the History of Present Illness.   PHYSICAL EXAM: BP 104/76   Pulse 84   Ht 6\' 2"  (1.88 m)   Wt (!) 389 lb 9.6 oz (176.7 kg)   SpO2 98%   BMI 50.02 kg/m  General: Obese AA male in no acute distress Head: Normocephalic and atraumatic Eyes:  Sclerae anicteric, conjunctiva pink. Ears: Normal auditory acuity Lungs: Clear throughout to auscultation; no W/R/R. Heart: Regular rate and rhythm; no M/R/G. Abdomen: Soft, non-distended.  BS present.  Mild LLQ TTP. Rectal:  No external abnormalities noted.  DRE did not reveal any masses.  Light brown stool on exam glove.  Anoscopy not performed as it would have been technically difficult due to his size. Musculoskeletal: Symmetrical with no gross deformities  Skin: No lesions on visible extremities Extremities: No edema  Neurological: Alert oriented x 4, grossly non-focal Psychological:  Alert and cooperative. Normal mood and affect  ASSESSMENT AND PLAN: *25 year old male with longstanding complaints of GI issues, mainly intermittent abdominal pain  and constipation.  Had one episode of rectal bleeding recently.  No source of rectal bleeding seen on exam today, but anoscopy not performed to evaluate for internal hemorrhoids.  This sounds very likely that it was outlet type bleeding.  Will observe for now.  Ultimately he needs to try to avoid constipation and straining.  I recommended that he start MiraLAX one half capful to a full capful daily mixed in 8 ounces of liquid, increase as needed.  He does report a persistent left lower quadrant abdominal pain  since November.  We will schedule CT scan of the abdomen and pelvis with contrast to evaluate that and his other intermittent generalized abdominal pains.  I think that he likely has IBS.  Will try levsin prn for abdominal pain/cramping.  Prescription sent to pharmacy.  We will follow up pending results of CT scan.   CC:  Steve Tate, [place]

## 2020-02-29 NOTE — Progress Notes (Signed)
____________________________________________________________  Attending physician addendum:  Thank you for sending this case to me. I have reviewed the entire note and agree with the plan.  Sounds most likely to be benign anorectal bleeding from constipation. It most likely does not need colonoscopy unless it is a recurrent problem or there is some abnormality on CT scan that may warrant it.  Amada Jupiter, MD  ____________________________________________________________

## 2020-03-10 ENCOUNTER — Ambulatory Visit (HOSPITAL_COMMUNITY): Payer: 59

## 2020-03-18 ENCOUNTER — Ambulatory Visit (HOSPITAL_COMMUNITY): Payer: 59

## 2021-01-12 ENCOUNTER — Other Ambulatory Visit: Payer: Self-pay

## 2021-01-12 ENCOUNTER — Emergency Department (HOSPITAL_BASED_OUTPATIENT_CLINIC_OR_DEPARTMENT_OTHER)
Admission: EM | Admit: 2021-01-12 | Discharge: 2021-01-12 | Disposition: A | Discharge status: Home / Self Care | Payer: 59 | Attending: Student | Admitting: Student

## 2021-01-12 ENCOUNTER — Encounter (HOSPITAL_BASED_OUTPATIENT_CLINIC_OR_DEPARTMENT_OTHER): Payer: Self-pay | Admitting: Emergency Medicine

## 2021-01-12 DIAGNOSIS — K6289 Other specified diseases of anus and rectum: Secondary | ICD-10-CM | POA: Insufficient documentation

## 2021-01-12 MED ORDER — HYDROCODONE-ACETAMINOPHEN 5-325 MG PO TABS
1.0000 | ORAL_TABLET | Freq: Once | ORAL | Status: AC
  Administered 2021-01-12: 1 via ORAL
  Filled 2021-01-12: qty 1

## 2021-01-12 MED ORDER — KETOROLAC TROMETHAMINE 15 MG/ML IJ SOLN
15.0000 mg | Freq: Once | INTRAMUSCULAR | Status: AC
  Administered 2021-01-12: 15 mg via INTRAMUSCULAR
  Filled 2021-01-12: qty 1

## 2021-01-12 MED ORDER — LIDOCAINE VISCOUS HCL 2 % MT SOLN
15.0000 mL | Freq: Once | OROMUCOSAL | Status: AC
  Administered 2021-01-12: 15 mL via OROMUCOSAL
  Filled 2021-01-12: qty 15

## 2021-01-12 MED ORDER — POLYETHYLENE GLYCOL 3350 17 G PO PACK: 17.0000 g | PACK | Freq: Every day | ORAL | 0 refills | Status: DC

## 2021-01-12 MED ORDER — OXYCODONE HCL 5 MG PO TABS: 5.0000 mg | ORAL_TABLET | ORAL | 0 refills | Status: DC | PRN

## 2021-01-12 MED ORDER — NAPROXEN 500 MG PO TABS: 500.0000 mg | ORAL_TABLET | Freq: Two times a day (BID) | ORAL | 0 refills | Status: DC

## 2021-01-12 NOTE — ED Provider Notes (Addendum)
[facility] Provider Note   CSN: [id number] Arrival date & time: 01/12/21  [id number]     History Chief Complaint  Patient presents with   Hemorrhoids    Steve Tate is a 25 y.o. male with PMH constipation, obesity who presents the emergency department for evaluation of rectal pain.  Patient states that for the last 48 hours he has had worsening rectal pain that gets in the ways of ability to walk.  He endorses intermittent streaks of blood in his stool and persistent straining while stooling.  He has tried Anusol without relief.  He states that the rectal pain radiates into the scrotum but denies dysuria, chest pain, shortness of breath, abdominal pain, nausea, vomiting or other systemic symptoms.  Denies rash or penile discharge.  HPI     Past Medical History:  Diagnosis Date   Obesity     Patient Active Problem List   Diagnosis Date Noted   LLQ abdominal pain 02/28/2020   Constipation 02/28/2020   Generalized abdominal pain 02/28/2020    Past Surgical History:  Procedure Laterality Date   APPENDECTOMY         Family History  Problem Relation Age of Onset   Throat cancer Paternal Grandmother    Colon cancer Neg Hx    Pancreatic cancer Neg Hx    Stomach cancer Neg Hx    Liver disease Neg Hx    Esophageal cancer Neg Hx     Social History   Tobacco Use   Smoking status: Never   Smokeless tobacco: Never  Vaping Use   Vaping Use: Never used  Substance Use Topics   Alcohol use: Yes    Comment: socially   Drug use: No    Home Medications Prior to Admission medications   Medication Sig Start Date End Date Taking? Authorizing Provider  acetaminophen (TYLENOL) 325 MG tablet Take 2 tablets (650 mg total) by mouth every 6 (six) hours as needed. Do not take more than 4000mg  of tylenol per day 06/17/17   Couture, Cortni S, PA-C  hyoscyamine (LEVSIN SL) 0.125 MG SL tablet Place 1 tablet (0.125 mg total) under the tongue every 8  (eight) hours as needed. 02/28/20   Zehr, Laban Emperor, PA-C  ibuprofen (ADVIL,MOTRIN) 400 MG tablet Take 1 tablet (400 mg total) by mouth every 6 (six) hours as needed. 06/17/17   Couture, Cortni S, PA-C  phentermine 37.5 MG capsule Take 37.5 mg by mouth every morning.    [provider]    Allergies    Patient has no known allergies.  Review of Systems   Review of Systems  Constitutional:  Negative for chills and fever.  HENT:  Negative for ear pain and sore throat.   Eyes:  Negative for pain and visual disturbance.  Respiratory:  Negative for cough and shortness of breath.   Cardiovascular:  Negative for chest pain and palpitations.  Gastrointestinal:  Positive for rectal pain. Negative for abdominal pain and vomiting.  Genitourinary:  Negative for dysuria and hematuria.  Musculoskeletal:  Negative for arthralgias and back pain.  Skin:  Negative for color change and rash.  Neurological:  Negative for seizures and syncope.  All other systems reviewed and are negative.  Physical Exam Updated Vital Signs BP (!) 154/95 (BP Location: Right Arm)   Pulse (!) 109   Temp 99.2 F (37.3 C) (Oral)   Resp (!) 22   Ht 6\' 2"  (1.88 m)   Wt (!) 176.9 kg  SpO2 99%   BMI 50.07 kg/m   Physical Exam Vitals and nursing note reviewed.  Constitutional:      General: He is not in acute distress.    Appearance: He is well-developed.  HENT:     Head: Normocephalic and atraumatic.  Eyes:     Conjunctiva/sclera: Conjunctivae normal.  Cardiovascular:     Rate and Rhythm: Normal rate and regular rhythm.     Heart sounds: No murmur heard. Pulmonary:     Effort: Pulmonary effort is normal. No respiratory distress.     Breath sounds: Normal breath sounds.  Abdominal:     Palpations: Abdomen is soft.     Tenderness: There is no abdominal tenderness.  Musculoskeletal:        General: No swelling.     Cervical back: Neck supple.  Skin:    General: Skin is warm and dry.     Capillary  Refill: Capillary refill takes less than 2 seconds.  Neurological:     Mental Status: He is alert.  Psychiatric:        Mood and Affect: Mood normal.    ED Results / Procedures / Treatments   Labs (all labs ordered are listed, but only abnormal results are displayed) Labs Reviewed - No data to display  EKG None  Radiology No results found.  Procedures Procedures   Medications Ordered in ED Medications  lidocaine (XYLOCAINE) 2 % viscous mouth solution 15 mL (15 mLs Mouth/Throat Given 01/12/21 2100)    ED Course  I have reviewed the triage vital signs and the nursing notes.  Pertinent labs & imaging results that were available during my care of the patient were reviewed by me and considered in my medical decision making (see chart for details).    MDM Rules/Calculators/A&P                           Patient seen emergency department for evaluation of rectal pain.  Physical exam is largely unremarkable with no evidence of rectal fissure, no external hemorrhoids. GU exam with no testicular swelling or tenderness with elevation, no abnormal lie, intact cremasteric reflex. No evidence of fournier gangrene or perianal/perirectal abscess.  Patient given lidocaine topically near the rectum with no improvement of his pain.  He was then given Toradol and a single dose of 5 mg Norco which relieved his pain significantly.  The cause of his rectal pain is currently unknown and he will require outpatient colorectal follow-up.  Low suspicion for proctitis as the patient is had no fevers or diarrhea.  In the setting of his constipation, MiraLAX also sent to the pharmacy as the opioids he was sent home on would likely worsen his constipation.  Patient states that he will call and set up outpatient colorectal appointment tomorrow.  On reevaluation, patient safer discharge and hemodynamically stable. Final Clinical Impression(s) / ED Diagnoses Final diagnoses:  None    Rx / DC Orders ED  Discharge Orders     None        Daimian Sudberry, MD 01/12/21 2342    Glendora Score, MD 01/13/21 [id number]    Glendora Score, MD 01/13/21 0139

## 2021-01-12 NOTE — Discharge Instructions (Addendum)
You were seen in the emergency department for evaluation of rectal pain.  Your exam today did not show any evidence of hemorrhoids or rectal tear.  You will require outpatient colorectal follow-up and the number for [facility] has been attached to this discharge instructions.  I provided a very short prescription for oxycodone for breakthrough pain only.  Please take the Naprosyn and Tylenol first and only take the oxycodone if the pain is breaking through this.  Return the emergency department if you have new or worsening rectal pain, rectal bleeding, fever, vomiting or any other concerning symptoms.

## 2021-01-12 NOTE — ED Triage Notes (Signed)
Pt states may have Hemorrhoid, is causing pain to scrotum and abdomen.

## 2021-01-12 NOTE — ED Notes (Signed)
D/c paperwork reviewed with pt, including f/u care and prescriptions. All questions addressed prior to d/c. Pt ambulatory to ED exit.

## 2021-01-18 ENCOUNTER — Emergency Department (HOSPITAL_BASED_OUTPATIENT_CLINIC_OR_DEPARTMENT_OTHER)
Admission: EM | Admit: 2021-01-18 | Discharge: 2021-01-18 | Disposition: A | Discharge status: Home / Self Care | Payer: Self-pay | Attending: Emergency Medicine | Admitting: Emergency Medicine

## 2021-01-18 ENCOUNTER — Emergency Department (HOSPITAL_BASED_OUTPATIENT_CLINIC_OR_DEPARTMENT_OTHER): Payer: Self-pay

## 2021-01-18 ENCOUNTER — Other Ambulatory Visit: Payer: Self-pay

## 2021-01-18 ENCOUNTER — Encounter (HOSPITAL_BASED_OUTPATIENT_CLINIC_OR_DEPARTMENT_OTHER): Payer: Self-pay | Admitting: Emergency Medicine

## 2021-01-18 DIAGNOSIS — D72829 Elevated white blood cell count, unspecified: Secondary | ICD-10-CM | POA: Insufficient documentation

## 2021-01-18 DIAGNOSIS — K6289 Other specified diseases of anus and rectum: Secondary | ICD-10-CM | POA: Insufficient documentation

## 2021-01-18 LAB — CBC WITH DIFFERENTIAL/PLATELET
Abs Immature Granulocytes: 0.04 10*3/uL (ref 0.00–0.07)
Basophils Absolute: 0.1 10*3/uL (ref 0.0–0.1)
Basophils Relative: 1 %
Eosinophils Absolute: 0.1 10*3/uL (ref 0.0–0.5)
Eosinophils Relative: 1 %
HCT: 38.8 % — ABNORMAL LOW (ref 39.0–52.0)
Hemoglobin: 12.3 g/dL — ABNORMAL LOW (ref 13.0–17.0)
Immature Granulocytes: 0 %
Lymphocytes Relative: 19 %
Lymphs Abs: 2.1 10*3/uL (ref 0.7–4.0)
MCH: 27.3 pg (ref 26.0–34.0)
MCHC: 31.7 g/dL (ref 30.0–36.0)
MCV: 86 fL (ref 80.0–100.0)
Monocytes Absolute: 1.1 10*3/uL — ABNORMAL HIGH (ref 0.1–1.0)
Monocytes Relative: 10 %
Neutro Abs: 7.8 10*3/uL — ABNORMAL HIGH (ref 1.7–7.7)
Neutrophils Relative %: 69 %
Platelets: 451 10*3/uL — ABNORMAL HIGH (ref 150–400)
RBC: 4.51 MIL/uL (ref 4.22–5.81)
RDW: 11.8 % (ref 11.5–15.5)
WBC: 11.2 10*3/uL — ABNORMAL HIGH (ref 4.0–10.5)
nRBC: 0 % (ref 0.0–0.2)

## 2021-01-18 LAB — BASIC METABOLIC PANEL
Anion gap: 7 (ref 5–15)
BUN: 10 mg/dL (ref 6–20)
CO2: 30 mmol/L (ref 22–32)
Calcium: 9.2 mg/dL (ref 8.9–10.3)
Chloride: 100 mmol/L (ref 98–111)
Creatinine, Ser: 1.07 mg/dL (ref 0.61–1.24)
GFR, Estimated: >60 mL/min (ref >60)
Glucose, Bld: 97 mg/dL (ref 70–99)
Potassium: 3.9 mmol/L (ref 3.5–5.1)
Sodium: 137 mmol/L (ref 135–145)

## 2021-01-18 MED ORDER — AMOXICILLIN-POT CLAVULANATE 875-125 MG PO TABS: 1.0000 | ORAL_TABLET | Freq: Two times a day (BID) | ORAL | 0 refills | Status: DC

## 2021-01-18 MED ORDER — POLYETHYLENE GLYCOL 3350 17 G PO PACK: 17.0000 g | PACK | Freq: Every day | ORAL | 0 refills | Status: DC

## 2021-01-18 MED ORDER — IOHEXOL 300 MG/ML  SOLN
100.0000 mL | Freq: Once | INTRAMUSCULAR | Status: AC | PRN
  Administered 2021-01-18: 20:00:00 100 mL via INTRAVENOUS

## 2021-01-18 NOTE — ED Triage Notes (Signed)
Pt arrives via [facility] from home with c/o continued rectal pain. Denies any blood in stool per EMS. VSS per EMS.

## 2021-01-18 NOTE — ED Notes (Signed)
Received care of patient pacing in room.  Continues to complain of rectal pain.  Bed in lowest position with side rails up x2.  Call bell in reach.   Patient ambulatory to bathroom to void.

## 2021-01-18 NOTE — ED Notes (Signed)
Patient discharged to home.  All discharge instructions reviewed.  Patient verbalized understanding via teachback method.  VS WDL.  Respirations even and unlabored.  Ambulatory out of ED.   °

## 2021-01-18 NOTE — Discharge Instructions (Addendum)
You were seen in the emergency room today with rectal pain.  There is a small area of inflammation near the rectum which may be some developing infection.  I am starting you on antibiotics and will have you continue your MiraLAX to help with constipation.  You should take warm soaks in the tub to ease your pain symptoms.  Please call the surgeon listed to schedule a follow-up appointment later this week.  If your symptoms worsen you develop high fevers, chills, other severe symptoms please return to the emergency department.

## 2021-01-18 NOTE — ED Provider Notes (Signed)
Emergency Department Provider Note   I have reviewed the triage vital signs and the nursing notes.   HISTORY  Chief Complaint Rectal Pain (Arrived via EMS stating has had rectal pain x 5 days)   HPI Steve Tate is a 25 y.o. male presents to the emergency department for evaluation of continued rectal pain.  He was seen in the emergency department 6 days ago and was ultimately treated with pain medication for presumed hemorrhoid related pain.  He denies resolution with medication provided.  He states he has had a Izaan Kingbird time struggle with constipation since he was a child.  This is continued.  He did have a painful bowel movement just prior to his initial ED evaluation.  He is continue to have constipation.  No fevers.  No chills.  He has continued moderate to severe pain in the rectum. No anterior abdominal pain.   Past Medical History:  Diagnosis Date   Obesity     Patient Active Problem List   Diagnosis Date Noted   LLQ abdominal pain 02/28/2020   Constipation 02/28/2020   Generalized abdominal pain 02/28/2020    Past Surgical History:  Procedure Laterality Date   APPENDECTOMY      Allergies Patient has no known allergies.  Family History  Problem Relation Age of Onset   Throat cancer Paternal Grandmother    Colon cancer Neg Hx    Pancreatic cancer Neg Hx    Stomach cancer Neg Hx    Liver disease Neg Hx    Esophageal cancer Neg Hx     Social History Social History   Tobacco Use   Smoking status: Never   Smokeless tobacco: Never  Vaping Use   Vaping Use: Never used  Substance Use Topics   Alcohol use: Yes    Comment: socially   Drug use: No    Review of Systems  Constitutional: No fever/chills Eyes: No visual changes. ENT: No sore throat. Cardiovascular: Denies chest pain. Respiratory: Denies shortness of breath. Gastrointestinal: No abdominal pain.  No nausea, no vomiting.  No diarrhea. Positive constipation. Positive rectal pain.   Genitourinary: Negative for dysuria. Musculoskeletal: Negative for back pain. Skin: Negative for rash. Neurological: Negative for headaches, focal weakness or numbness.  10-point ROS otherwise negative.  ____________________________________________   PHYSICAL EXAM:  VITAL SIGNS: ED Triage Vitals  Enc Vitals Group     BP 01/18/21 1535 (!) 178/80     Pulse Rate 01/18/21 1535 98     Resp 01/18/21 1535 18     Temp 01/18/21 1535 98.4 F (36.9 C)     Temp Source 01/18/21 1535 Oral     SpO2 01/18/21 1535 98 %   Constitutional: Alert and oriented. Well appearing and in no acute distress. Eyes: Conjunctivae are normal.  Head: Atraumatic. Nose: No congestion/rhinnorhea. Mouth/Throat: Mucous membranes are moist.   Neck: No stridor.  Cardiovascular: Normal rate, regular rhythm. Good peripheral circulation. Grossly normal heart sounds.   Respiratory: Normal respiratory effort.  No retractions. Lungs CTAB. Gastrointestinal: Soft and nontender. No distention. Rectal exam performed with patient's verbal consent. No visible hemorrhoid or fissure.  Musculoskeletal: No lower extremity tenderness nor edema. No gross deformities of extremities. Neurologic:  Normal speech and language. No gross focal neurologic deficits are appreciated.  Skin:  Skin is warm, dry and intact. No rash noted.   ____________________________________________   LABS (all labs ordered are listed, but only abnormal results are displayed)  Labs Reviewed  CBC WITH DIFFERENTIAL/PLATELET - Abnormal; Notable for  the following components:      Result Value   WBC 11.2 (*)    Hemoglobin 12.3 (*)    HCT 38.8 (*)    Platelets 451 (*)    Neutro Abs 7.8 (*)    Monocytes Absolute 1.1 (*)    All other components within normal limits  BASIC METABOLIC PANEL   ____________________________________________  RADIOLOGY  CT ABDOMEN PELVIS W CONTRAST  Result Date: 01/18/2021 CLINICAL DATA:  Rectal pain. EXAM: CT ABDOMEN AND  PELVIS WITH CONTRAST TECHNIQUE: Multidetector CT imaging of the abdomen and pelvis was performed using the standard protocol following bolus administration of intravenous contrast. CONTRAST:  OMNIPAQUE IOHEXOL 300 MG/ML  SOLN COMPARISON:  None. FINDINGS: Lower chest: No acute abnormality. Hepatobiliary: No focal liver abnormality is seen. No gallstones, gallbladder wall thickening, or biliary dilatation. Pancreas: Unremarkable. No pancreatic ductal dilatation or surrounding inflammatory changes. Spleen: Normal in size without focal abnormality. Adrenals/Urinary Tract: There is a 2 cm right renal cyst. Otherwise, the kidneys are within normal limits. The bilateral adrenal glands and bladder are within normal limits. Stomach/Bowel: Stomach is within normal limits. Appendix is likely surgically absent. There is no bowel obstruction. Small bowel, colon and stomach are within normal limits. There is edema posterior to the anal rectal junction. There is a low-attenuation collection surrounding the posterior aspect of the distal rectum, left greater than right. This area measures approximately 3.0 x 3.0 cm on image 2/105 Vascular/Lymphatic: No significant vascular findings are present. No enlarged abdominal or pelvic lymph nodes. Reproductive: Prostate is unremarkable. Other: No abdominal wall hernia or abnormality. No abdominopelvic ascites. Musculoskeletal: No acute or significant osseous findings. IMPRESSION: 1. Findings worrisome for phlegmon/early abscess along the posterior aspect of the anorectal junction, left greater than right. Electronically Signed   By: Darliss Cheney M.D.   On: 01/18/2021 20:05    ____________________________________________   PROCEDURES  Procedure(s) performed:   Procedures  None ____________________________________________   INITIAL IMPRESSION / ASSESSMENT AND PLAN / ED COURSE  Pertinent labs & imaging results that were available during my care of the patient were  reviewed by me and considered in my medical decision making (see chart for details).    Patient returns to the ED with rectal pain. No outward sign of hemorrhoids, fissure, or visible abscess. Pain on rectal exam. Concern for possible developing perirectal abscess. Will send labs and follow CT.   Labs with mild leukocytosis. Borderline fever on re-check. CT showing developing phlegmon/abscess. Discussed with Dr. Gerrit Friends by phone who reviewed the case and CT images. Plan for warm soaks, constipation mgmt, and Augmentin. He will send clinic a message for a follow up appointment later this week for follow up and reassessment after trial of outpatient abx. Discussed plan with patient who will call the clinic in the AM. Discussed ED return precautions.   ____________________________________________  FINAL CLINICAL IMPRESSION(S) / ED DIAGNOSES  Final diagnoses:  Rectal pain     MEDICATIONS GIVEN DURING THIS VISIT:  Medications  iohexol (OMNIPAQUE) 300 MG/ML solution 100 mL (100 mLs Intravenous Contrast Given 01/18/21 1937)     NEW OUTPATIENT MEDICATIONS STARTED DURING THIS VISIT:  Discharge Medication List as of 01/18/2021  8:34 PM     START taking these medications   Details  amoxicillin-clavulanate (AUGMENTIN) 875-125 MG tablet Take 1 tablet by mouth every 12 (twelve) hours., Starting Sun 01/18/2021, Normal        Note:  This document was prepared using Dragon voice recognition software and may include unintentional  dictation errors.  Alona Bene, MD, [facility]    Roniesha Hollingshead, Arlyss Repress, MD 01/19/21 [id number]

## 2021-01-18 NOTE — ED Notes (Signed)
Pt taken to CT, unable to obtain vitals at this time.

## 2021-09-04 ENCOUNTER — Emergency Department (HOSPITAL_BASED_OUTPATIENT_CLINIC_OR_DEPARTMENT_OTHER): Payer: 59

## 2021-09-04 ENCOUNTER — Emergency Department (HOSPITAL_BASED_OUTPATIENT_CLINIC_OR_DEPARTMENT_OTHER)
Admission: EM | Admit: 2021-09-04 | Discharge: 2021-09-04 | Disposition: A | Discharge status: Home / Self Care | Payer: 59 | Attending: Emergency Medicine | Admitting: Emergency Medicine

## 2021-09-04 ENCOUNTER — Encounter (HOSPITAL_BASED_OUTPATIENT_CLINIC_OR_DEPARTMENT_OTHER): Payer: Self-pay

## 2021-09-04 ENCOUNTER — Other Ambulatory Visit: Payer: Self-pay

## 2021-09-04 DIAGNOSIS — R0789 Other chest pain: Secondary | ICD-10-CM | POA: Insufficient documentation

## 2021-09-04 DIAGNOSIS — X500XXA Overexertion from strenuous movement or load, initial encounter: Secondary | ICD-10-CM | POA: Insufficient documentation

## 2021-09-04 LAB — BASIC METABOLIC PANEL
Anion gap: 6 (ref 5–15)
BUN: 10 mg/dL (ref 6–20)
CO2: 28 mmol/L (ref 22–32)
Calcium: 9.1 mg/dL (ref 8.9–10.3)
Chloride: 106 mmol/L (ref 98–111)
Creatinine, Ser: 0.94 mg/dL (ref 0.61–1.24)
GFR, Estimated: >60 mL/min (ref >60)
Glucose, Bld: 99 mg/dL (ref 70–99)
Potassium: 3.9 mmol/L (ref 3.5–5.1)
Sodium: 140 mmol/L (ref 135–145)

## 2021-09-04 LAB — CBC
HCT: 39.3 % (ref 39.0–52.0)
Hemoglobin: 12.3 g/dL — ABNORMAL LOW (ref 13.0–17.0)
MCH: 27 pg (ref 26.0–34.0)
MCHC: 31.3 g/dL (ref 30.0–36.0)
MCV: 86.2 fL (ref 80.0–100.0)
Platelets: 365 10*3/uL (ref 150–400)
RBC: 4.56 MIL/uL (ref 4.22–5.81)
RDW: 11.9 % (ref 11.5–15.5)
WBC: 8.5 10*3/uL (ref 4.0–10.5)
nRBC: 0 % (ref 0.0–0.2)

## 2021-09-04 LAB — TROPONIN I (HIGH SENSITIVITY): Troponin I (High Sensitivity): 2 ng/L (ref <18)

## 2021-09-04 MED ORDER — NAPROXEN 500 MG PO TABS: ORAL_TABLET | ORAL | 0 refills | Status: AC

## 2021-09-04 MED ORDER — NAPROXEN 250 MG PO TABS
500.0000 mg | ORAL_TABLET | Freq: Once | ORAL | Status: AC
  Administered 2021-09-04: 500 mg via ORAL
  Filled 2021-09-04: qty 2

## 2021-09-04 NOTE — ED Triage Notes (Signed)
Pt reports L sided cp with radiation to his neck and L arm x3 days with pain worsening today.

## 2021-09-04 NOTE — ED Provider Notes (Signed)
[facility] Provider Note: Lowella Dell, MD, FACEP  CSN: [id number] MRN: [id number] ARRIVAL: 09/04/21 at 0000 ROOM: [facility]02/[facility]   CHIEF COMPLAINT  Chest Pain   HISTORY OF PRESENT ILLNESS  09/04/21 2:50 AM Steve Tate is a 26 y.o. male who has had left upper chest pain for the past 2 to 3 days, worsening yesterday.  He has been lifting heavy objects lately.  He rates the pain as a 9 out of 10 and it is worse with palpation or movement of his left shoulder.  He is not short of breath with this.  He has taken Tylenol without relief.   Past Medical History:  Diagnosis Date   Obesity     Past Surgical History:  Procedure Laterality Date   APPENDECTOMY      Family History  Problem Relation Age of Onset   Throat cancer Paternal Grandmother    Colon cancer Neg Hx    Pancreatic cancer Neg Hx    Stomach cancer Neg Hx    Liver disease Neg Hx    Esophageal cancer Neg Hx     Social History   Tobacco Use   Smoking status: Never   Smokeless tobacco: Never  Vaping Use   Vaping Use: Never used  Substance Use Topics   Alcohol use: Yes    Comment: socially   Drug use: No    Prior to Admission medications   Medication Sig Start Date End Date Taking? Authorizing Provider  naproxen (NAPROSYN) 500 MG tablet Take 1 tablet twice daily as needed for chest wall pain. 09/04/21   Erby Sanderson, MD  phentermine 37.5 MG capsule Take 37.5 mg by mouth every morning.    [provider]    Allergies Patient has no known allergies.   REVIEW OF SYSTEMS  Negative except as noted here or in the History of Present Illness.   PHYSICAL EXAMINATION  Initial Vital Signs Blood pressure (!) 146/103, pulse 80, temperature 98.6 F (37 C), temperature source Oral, resp. rate 20, height 6\' 2"  (1.88 m), weight (!) 179.2 kg, SpO2 97 %.  Examination General: Well-developed, well-nourished male in no acute distress; appearance consistent with age of record HENT:  normocephalic; atraumatic Eyes: Normal appearance Neck: supple Heart: regular rate and rhythm Lungs: clear to auscultation bilaterally Chest: Left upper chest wall tenderness with pain on movement of left arm Abdomen: soft; nondistended; nontender; bowel sounds present Extremities: No deformity; full range of motion; pulses normal Neurologic: Awake, alert and oriented; motor function intact in all extremities and symmetric; no facial droop Skin: Warm and dry Psychiatric: Normal mood and affect   RESULTS  Summary of this visit's results, reviewed and interpreted by myself:   EKG Interpretation  Date/Time:  Friday September 04 2021 00:10:17 EDT Ventricular Rate:  80 PR Interval:  126 QRS Duration: 88 QT Interval:  370 QTC Calculation: 426 R Axis:   58 Text Interpretation: Normal sinus rhythm Nonspecific T wave abnormality Abnormal ECG Rate is slower Confirmed by Leenah Seidner (05-04-1969) on 09/04/2021 12:46:48 AM       Laboratory Studies: Results for orders placed or performed during the hospital encounter of 09/04/21 (from the past 24 hour(s))  Basic metabolic panel     Status: None   Collection Time: 09/04/21 12:14 AM  Result Value Ref Range   Sodium 140 135 - 145 mmol/L   Potassium 3.9 3.5 - 5.1 mmol/L   Chloride 106 98 - 111 mmol/L   CO2 28 22 - 32 mmol/L  Glucose, Bld 99 70 - 99 mg/dL   BUN 10 6 - 20 mg/dL   Creatinine, Ser 6.38 0.61 - 1.24 mg/dL   Calcium 9.1 8.9 - 75.6 mg/dL   GFR, Estimated >43 >32 mL/min   Anion gap 6 5 - 15  CBC     Status: Abnormal   Collection Time: 09/04/21 12:14 AM  Result Value Ref Range   WBC 8.5 4.0 - 10.5 K/uL   RBC 4.56 4.22 - 5.81 MIL/uL   Hemoglobin 12.3 (L) 13.0 - 17.0 g/dL   HCT 95.1 88.4 - 16.6 %   MCV 86.2 80.0 - 100.0 fL   MCH 27.0 26.0 - 34.0 pg   MCHC 31.3 30.0 - 36.0 g/dL   RDW 06.3 01.6 - 01.0 %   Platelets 365 150 - 400 K/uL   nRBC 0.0 0.0 - 0.2 %  Troponin I (High Sensitivity)     Status: None   Collection Time: 09/04/21  12:14 AM  Result Value Ref Range   Troponin I (High Sensitivity) 2 <18 ng/L   Imaging Studies: DG Chest 2 View  Result Date: 09/04/2021 CLINICAL DATA:  Chest pain EXAM: CHEST - 2 VIEW COMPARISON:  08/06/2018 FINDINGS: The heart size and mediastinal contours are within normal limits. Both lungs are clear. The visualized skeletal structures are unremarkable. IMPRESSION: No active cardiopulmonary disease. Electronically Signed   By: Jasmine Pang M.D.   On: 09/04/2021 00:32    ED COURSE and MDM  Nursing notes, initial and subsequent vitals signs, including pulse oximetry, reviewed and interpreted by myself.  Vitals:   09/04/21 0005 09/04/21 0006  BP:  (!) 146/103  Pulse:  80  Resp:  20  Temp:  98.6 F (37 C)  TempSrc:  Oral  SpO2:  97%  Weight: (!) 179.2 kg   Height: 6\' 2"  (1.88 m)    Medications  naproxen (NAPROSYN) tablet 500 mg (has no administration in time range)   The pain is clearly reproducible on palpation and movement and is consistent with chest wall pain.  It is not consistent with cardiac etiology.  His EKG and troponin are normal.   PROCEDURES  Procedures   ED DIAGNOSES     ICD-10-CM   1. Chest wall pain  R07.89          05-02-1987, MD 09/04/21 [phone]

## 2021-09-04 NOTE — ED Notes (Signed)
Patient ambulatory with NAD to room, c/o central cp radiating to neck. Worsens with movement.

## 2022-05-19 IMAGING — CT CT ABD-PELV W/ CM
2 of 4 series · 16 of 46 positions shown, 18 images · IV contrast (Omnipaque)
Comparison: None.

CLINICAL DATA: Rectal pain.

EXAM:
CT ABDOMEN AND PELVIS WITH CONTRAST
TECHNIQUE: Multidetector CT imaging of the abdomen and pelvis was performed
using the standard protocol following bolus administration of
intravenous contrast.
CONTRAST:  100mL OMNIPAQUE IOHEXOL 300 MG/ML  SOLN

[Series 2: axial st · axial · 0.98mm/px · z∈[+529,+1154]mm · 13 of 137 slices shown, 15 images]
[im 6/137  soft-tissue]
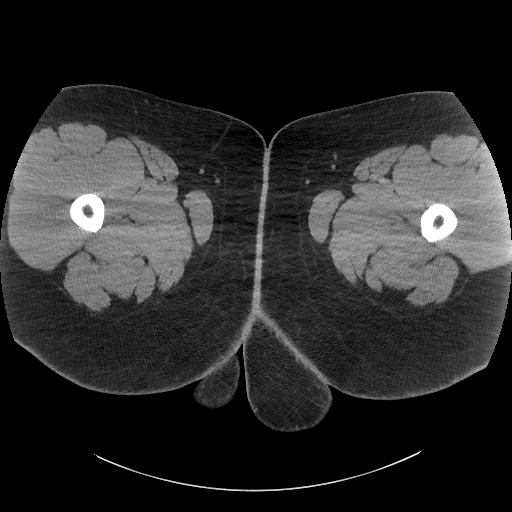
[im 6/137  bone]
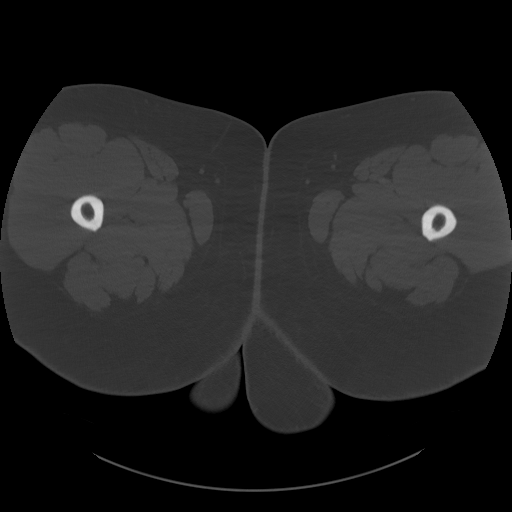
[im 17/137  soft-tissue]
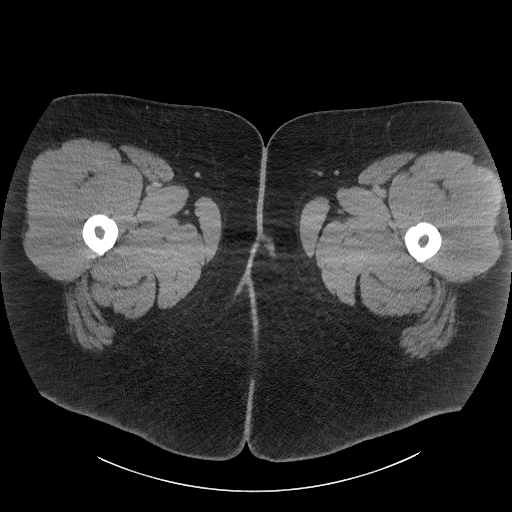
[im 28/137  soft-tissue]
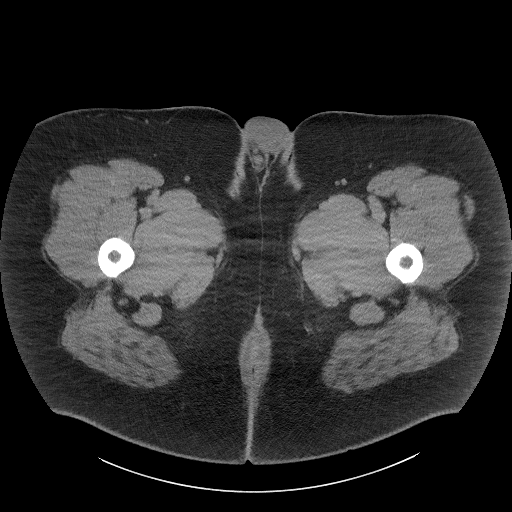
[im 39/137  soft-tissue]
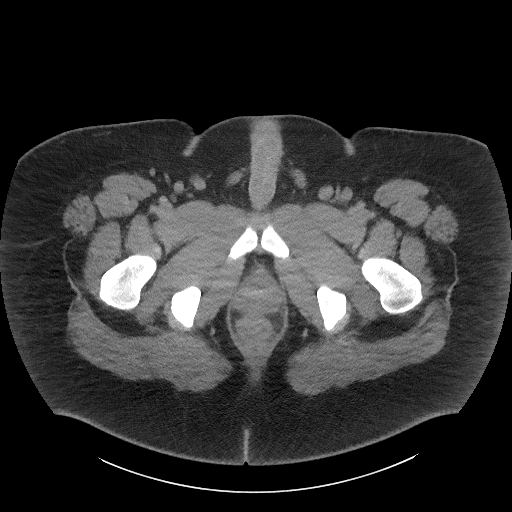
[im 49/137  soft-tissue]
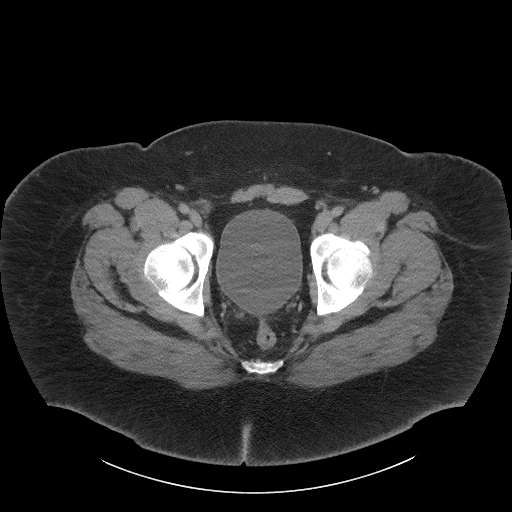
[im 60/137  soft-tissue]
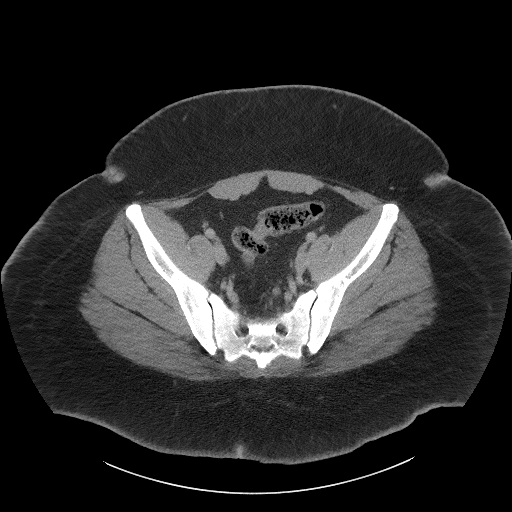
[im 71/137  soft-tissue]
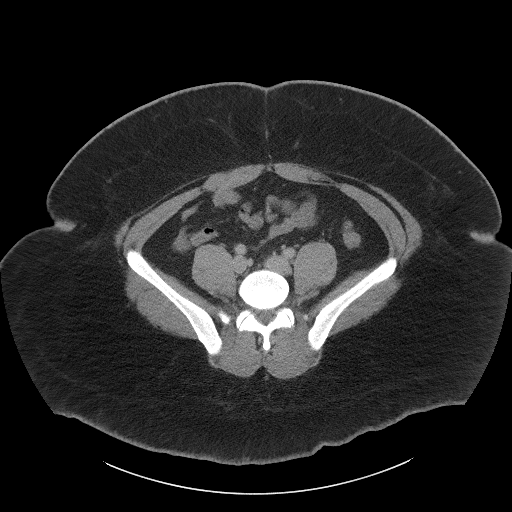
[im 77/137  soft-tissue]
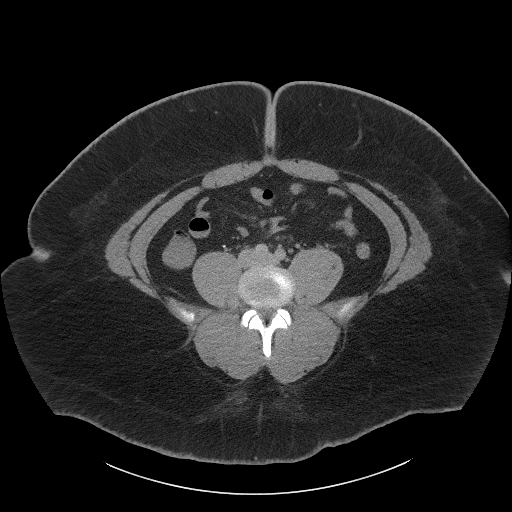
[im 88/137  soft-tissue]
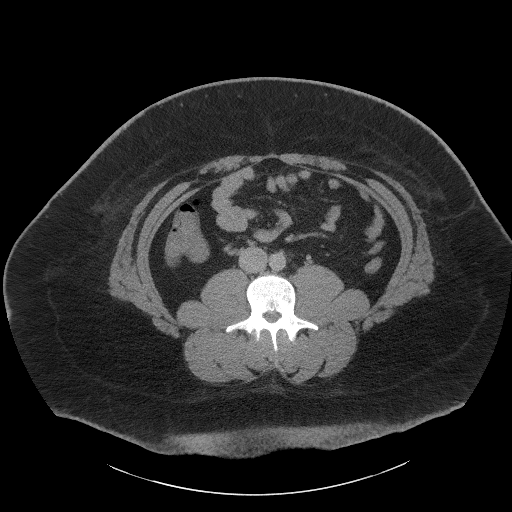
[im 88/137  bone]
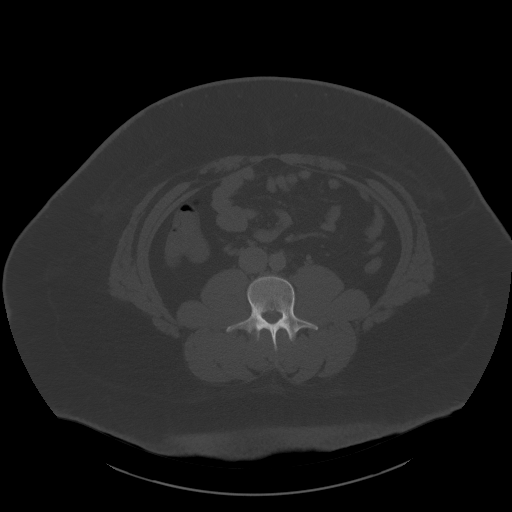
[im 98/137  soft-tissue]
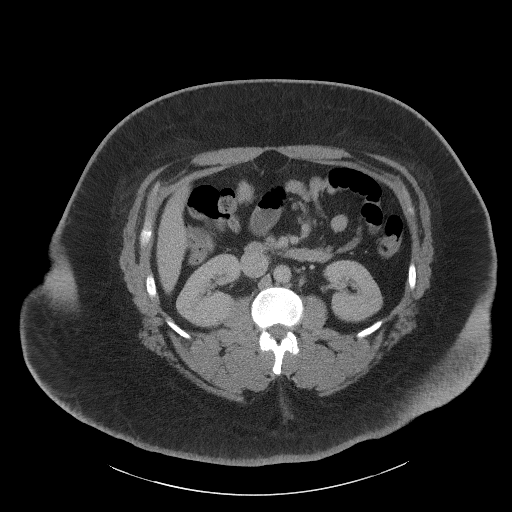
[im 109/137  soft-tissue]
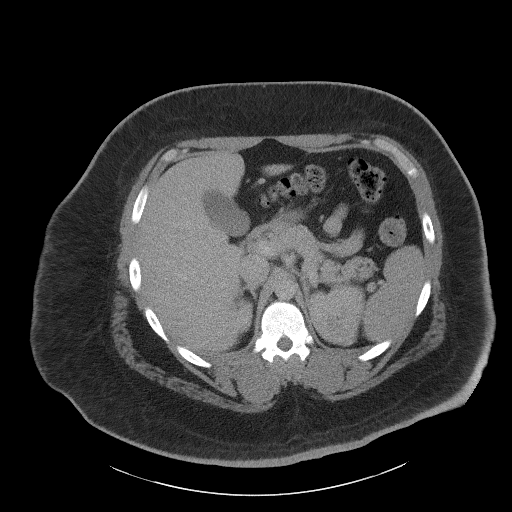
[im 120/137  soft-tissue]
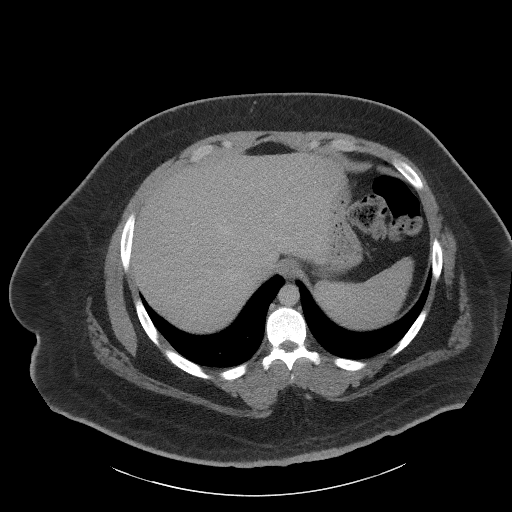
[im 131/137  soft-tissue]
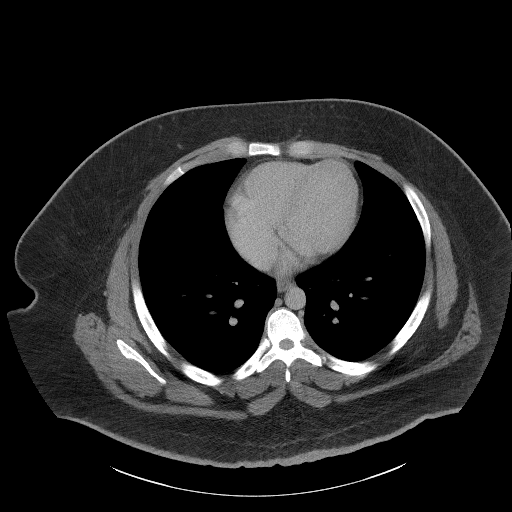

[Series 5: coronal st · coronal · 1.02mm/px · 3 of 144 slices shown]
[im 48/144  soft-tissue]
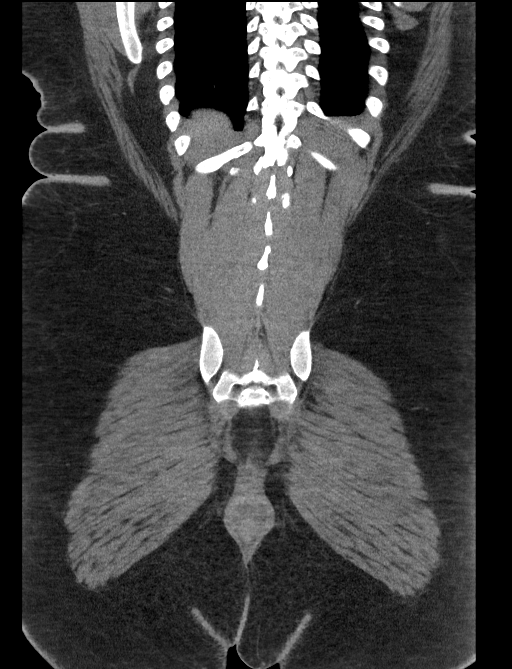
[im 64/144  soft-tissue]
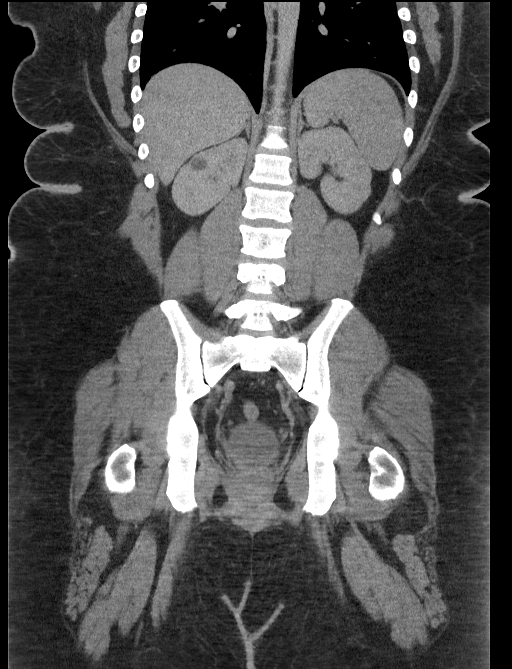
[im 80/144  soft-tissue]
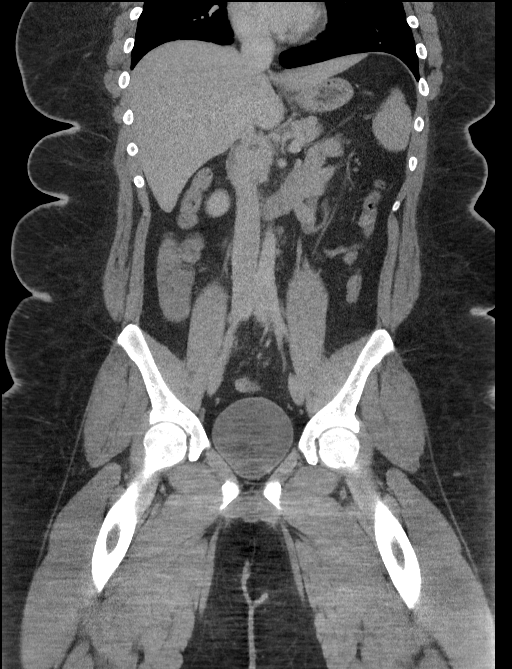

[16 of 46 positions shown; findings below may reference images not displayed]

FINDINGS: Lower chest: No acute abnormality.

Hepatobiliary: No focal liver abnormality is seen. No gallstones,
gallbladder wall thickening, or biliary dilatation.

Pancreas: Unremarkable. No pancreatic ductal dilatation or
surrounding inflammatory changes.

Spleen: Normal in size without focal abnormality.

Adrenals/Urinary Tract: There is a 2 cm right renal cyst. Otherwise,
the kidneys are within normal limits. The bilateral adrenal glands
and bladder are within normal limits.

Stomach/Bowel: Stomach is within normal limits. Appendix is likely
surgically absent. There is no bowel obstruction. Small bowel, colon
and stomach are within normal limits. There is edema posterior to
the anal rectal junction. There is a low-attenuation collection
surrounding the posterior aspect of the distal rectum, left greater
than right. This area measures approximately 3.0 x 3.0 cm on image
2/105

Vascular/Lymphatic: No significant vascular findings are present. No
enlarged abdominal or pelvic lymph nodes.

Reproductive: Prostate is unremarkable.

Other: No abdominal wall hernia or abnormality. No abdominopelvic
ascites.

Musculoskeletal: No acute or significant osseous findings.
IMPRESSION: 1. Findings worrisome for phlegmon/early abscess along the posterior
aspect of the anorectal junction, left greater than right.
